# Patient Record
Sex: Female | Born: 1965 | Race: White | Hispanic: No | Marital: Married | Smoking: Current every day smoker
Health system: Southern US, Community
[De-identification: ages and names within clinical notes are randomized; demographics above are authoritative.]

## PROBLEM LIST (undated history)

## (undated) DIAGNOSIS — E039 Hypothyroidism, unspecified: Secondary | ICD-10-CM

## (undated) DIAGNOSIS — K529 Noninfective gastroenteritis and colitis, unspecified: Secondary | ICD-10-CM

## (undated) DIAGNOSIS — A0472 Enterocolitis due to Clostridium difficile, not specified as recurrent: Secondary | ICD-10-CM

## (undated) DIAGNOSIS — M1990 Unspecified osteoarthritis, unspecified site: Secondary | ICD-10-CM

## (undated) DIAGNOSIS — K219 Gastro-esophageal reflux disease without esophagitis: Secondary | ICD-10-CM

## (undated) HISTORY — PX: KNEE ARTHROSCOPY: SUR90

## (undated) HISTORY — PX: ABDOMINAL HYSTERECTOMY: SHX81

## (undated) HISTORY — PX: TONSILLECTOMY: SUR1361

## (undated) HISTORY — PX: OTHER SURGICAL HISTORY: SHX169

---

## 2006-10-21 ENCOUNTER — Ambulatory Visit: Payer: Self-pay | Admitting: Emergency Medicine

## 2007-03-04 ENCOUNTER — Ambulatory Visit: Payer: Self-pay | Admitting: Obstetrics and Gynecology

## 2007-11-13 ENCOUNTER — Ambulatory Visit: Payer: Self-pay | Admitting: Family Medicine

## 2009-04-01 ENCOUNTER — Ambulatory Visit: Payer: Self-pay | Admitting: Family Medicine

## 2009-07-20 ENCOUNTER — Ambulatory Visit: Payer: Self-pay | Admitting: Otolaryngology

## 2009-07-22 ENCOUNTER — Ambulatory Visit: Payer: Self-pay | Admitting: Obstetrics and Gynecology

## 2009-09-03 ENCOUNTER — Ambulatory Visit: Payer: Self-pay | Admitting: Otolaryngology

## 2009-09-08 ENCOUNTER — Ambulatory Visit: Payer: Self-pay | Admitting: Otolaryngology

## 2009-12-03 ENCOUNTER — Ambulatory Visit: Payer: Self-pay | Admitting: Otolaryngology

## 2010-03-04 ENCOUNTER — Ambulatory Visit: Payer: Self-pay | Admitting: Otolaryngology

## 2010-10-04 ENCOUNTER — Ambulatory Visit: Payer: Self-pay | Admitting: Family Medicine

## 2010-10-28 IMAGING — CT CT NECK WITH CONTRAST
1 of 2 series · 9 of 14 positions shown, 12 images · non-contrast
Comparison: none

REASON FOR EXAM: R NECK PAIN AND ACUTE LYMPHADENITIS
COMMENTS:

[Series 3: soft tissue · axial · 0.47mm/px · z∈[-346,-106]mm · 9 of 100 slices shown, 12 images]
[im 10/100  soft-tissue]
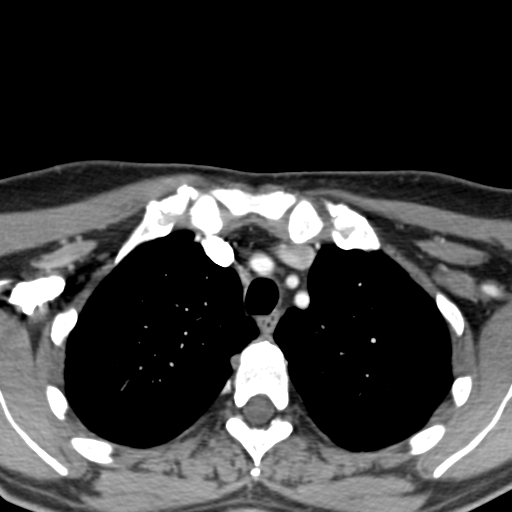
[im 10/100  bone]
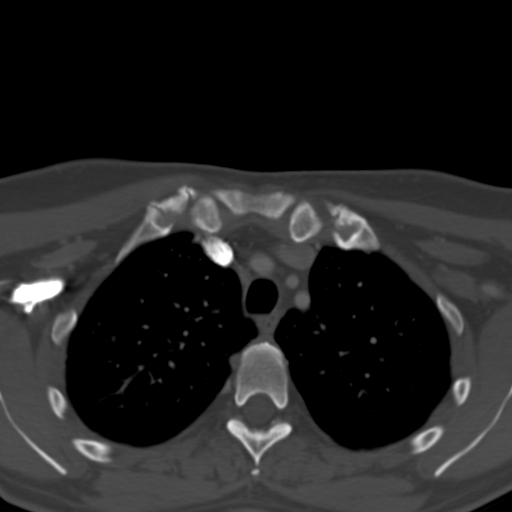
[im 20/100  bone]
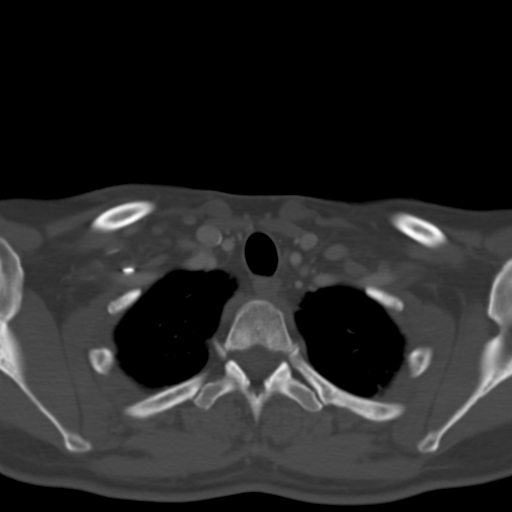
[im 30/100  bone]
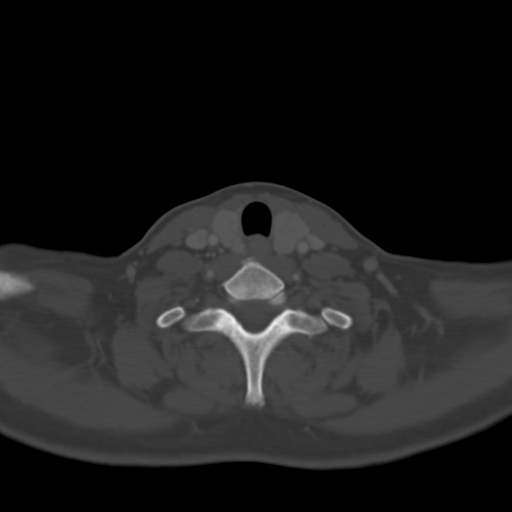
[im 40/100  bone]
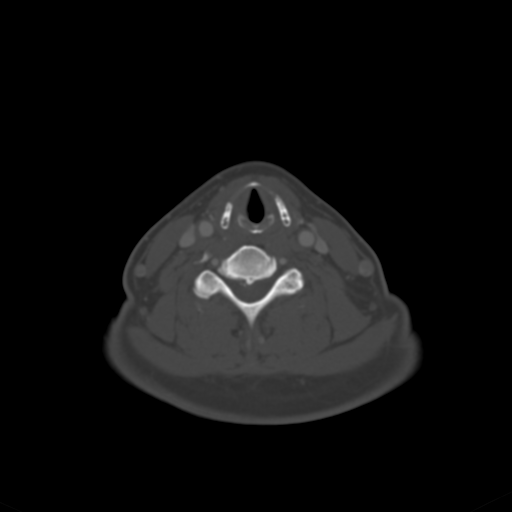
[im 50/100  soft-tissue]
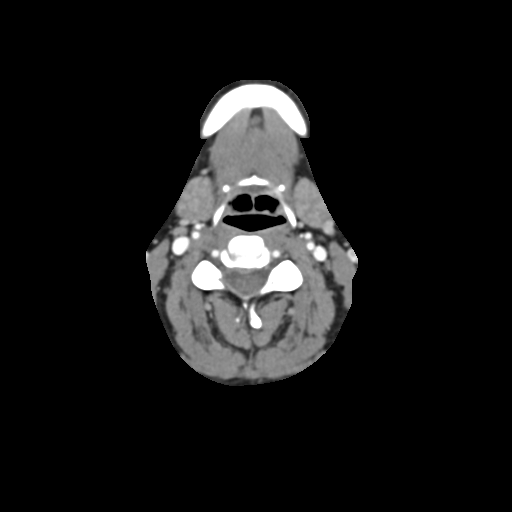
[im 50/100  bone]
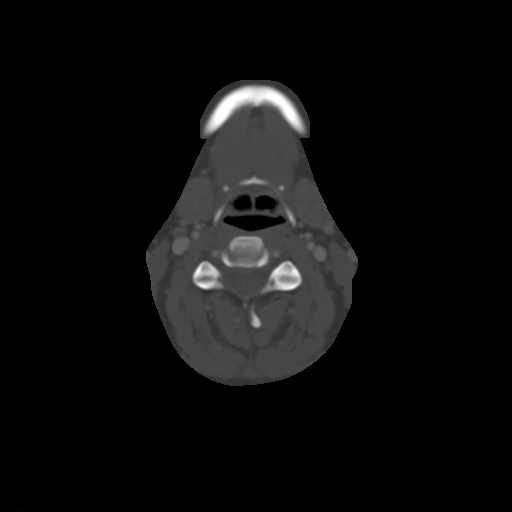
[im 60/100  bone]
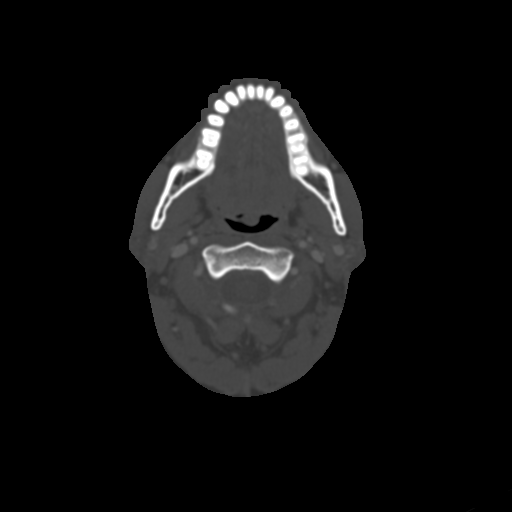
[im 70/100  bone]
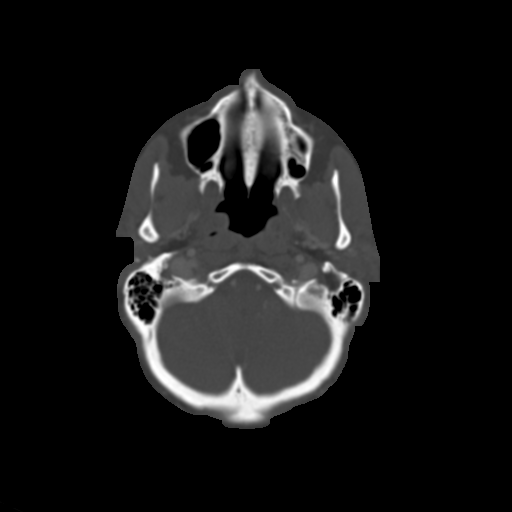
[im 80/100  bone]
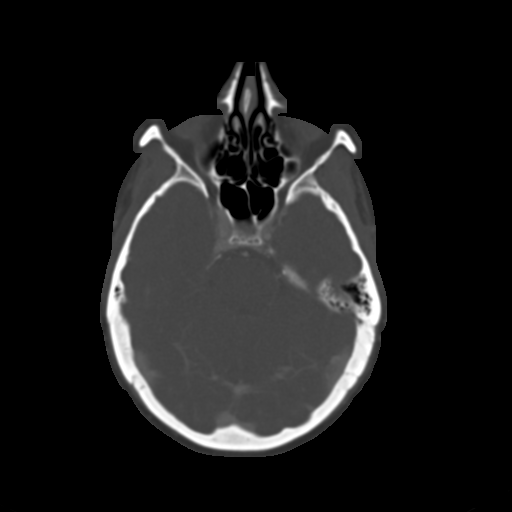
[im 90/100  soft-tissue]
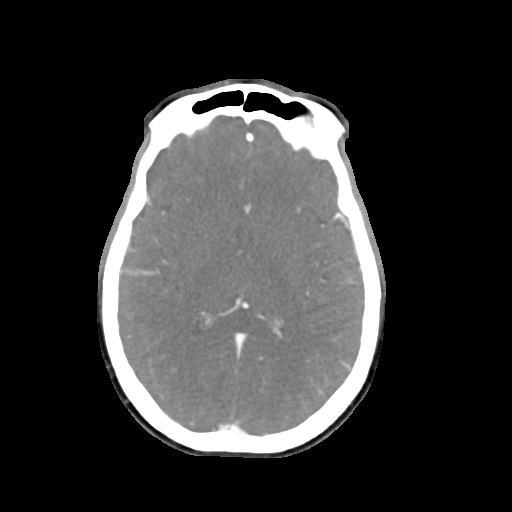
[im 90/100  bone]
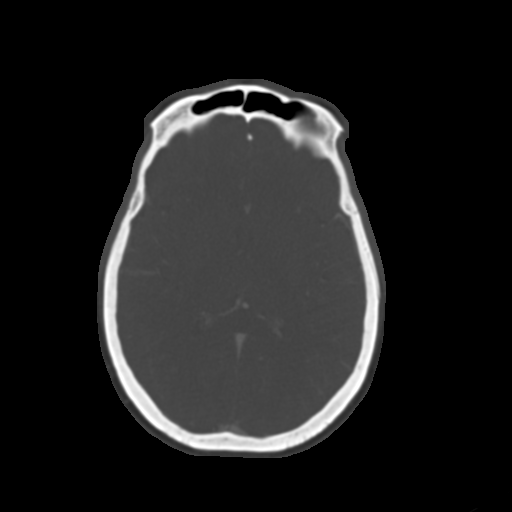

[9 of 14 positions shown; findings below may reference images not displayed]

PROCEDURE:     BENITEZ - BENITEZ NECK WITH CONTRAST  - September 08, 2009  [DATE]

RESULT:

Helical 3 mm sections were obtained from the skull base through the thoracic
inlet status post intravenous administration of 100 ml of Isovue 300.

Evaluation of the skull base demonstrates no evidence of masses nor regions
of abnormal enhancement, free fluid or loculated fluid collections.

Evaluation of the neck demonstrates no evidence of masses, free fluid. Lymph
nodes are appreciated within the anterior and posterior cervical chain as
well as within the submental region. The largest lymph node projects in the
submental region measuring approximately 7 mm in diameter. Vascular
structures are patent. The lung apices are unremarkable. The airway is
patent.
IMPRESSION: 1. Small, nonpathologic lymph nodes within the anterior and posterior
cervical chain without evidence of masses, free fluid nor loculated fluid
collections.

## 2011-04-25 ENCOUNTER — Ambulatory Visit: Payer: Self-pay | Admitting: Otolaryngology

## 2011-10-26 ENCOUNTER — Ambulatory Visit: Payer: Self-pay | Admitting: Family Medicine

## 2011-11-23 IMAGING — US ULTRASOUND RIGHT BREAST
1 series · 9 of 9 positions shown · non-contrast
Comparison: none

REASON FOR EXAM: nodule
COMMENTS:

PROCEDURE:     US  - US BREAST RIGHT  - October 04, 2010  [DATE]
RESULT:     Ultrasound of the 8 o'clock to 10 o'clock position of the right
breast reveals no discrete cystic or solid mass. The echotexture of the
imaged parenchyma was normal.

[Series 1: ultrasound right breast · 9 of 9 slices shown]
[im 1/9]
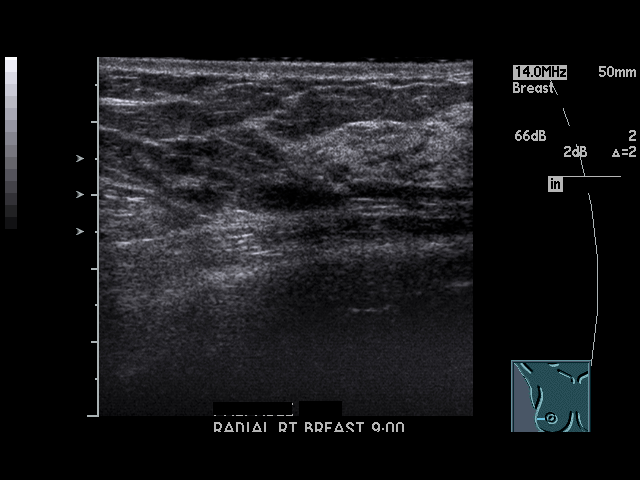
[im 2/9]
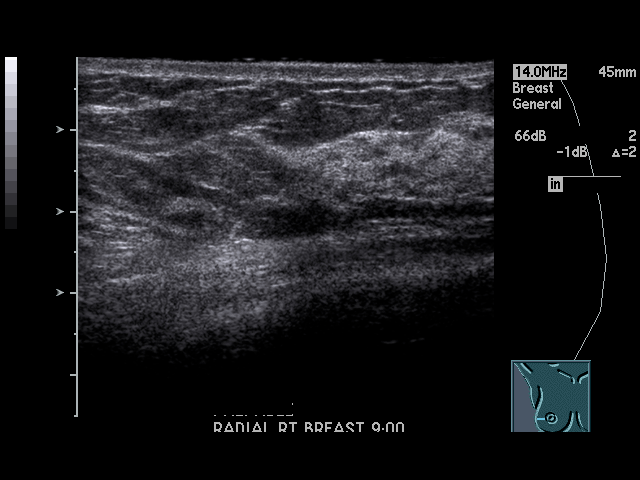
[im 3/9]
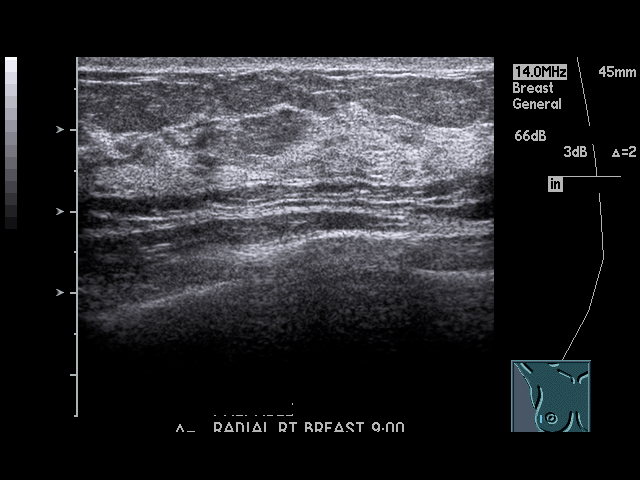
[im 4/9]
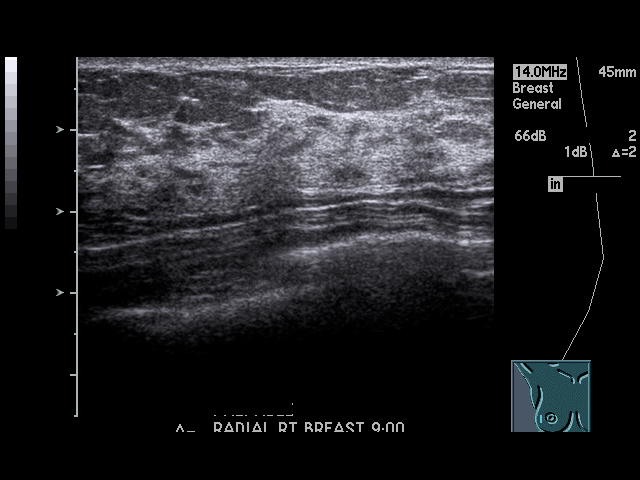
[im 5/9]
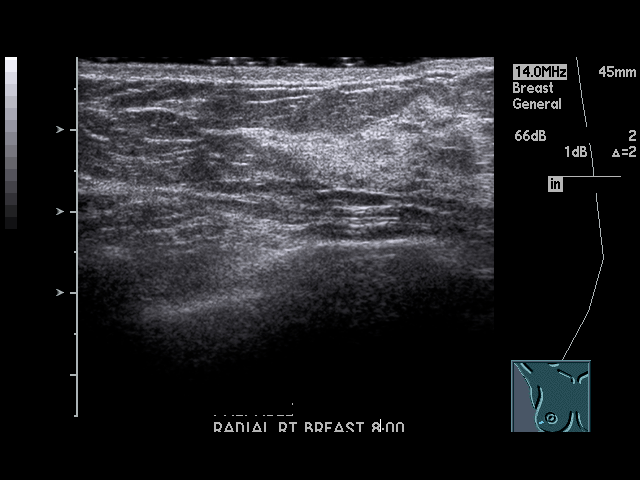
[im 6/9]
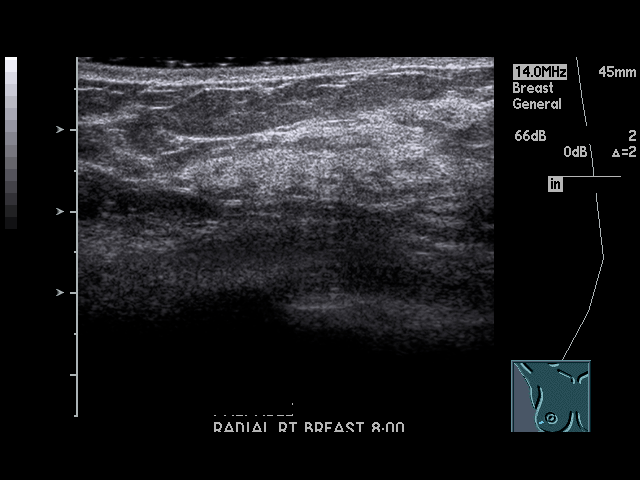
[im 7/9]
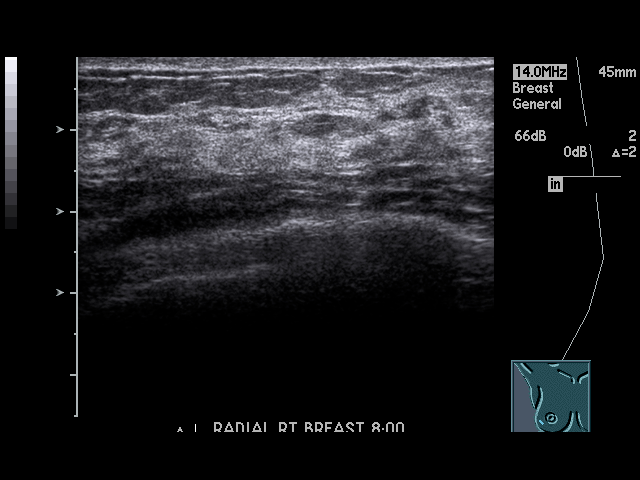
[im 8/9]
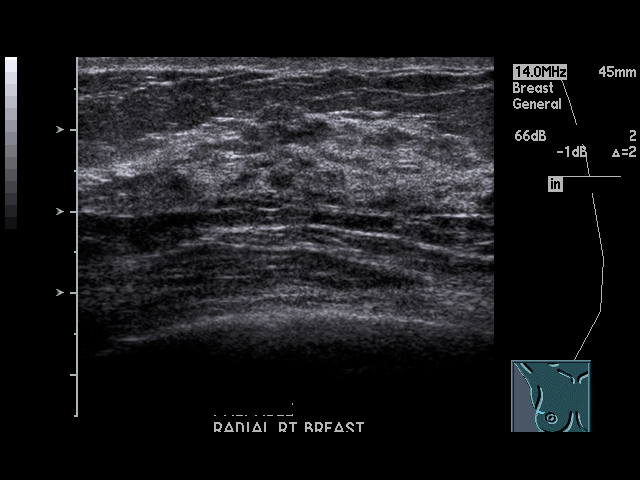
[im 9/9]
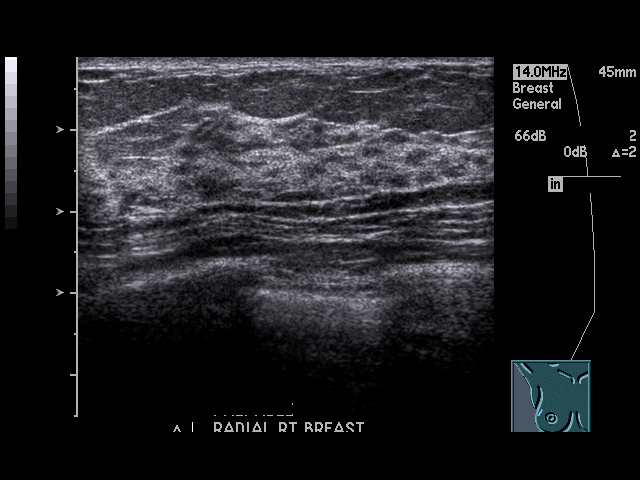

[9 of 9 positions shown; findings below may reference images not displayed]

IMPRESSION: 1.I do not see suspicious findings on this directed ultrasound of the right
breast. Please see the dictation of the diagnostic mammogram of this same
day for final recommendations and BI-RADS classification.

## 2012-06-14 ENCOUNTER — Ambulatory Visit: Payer: Self-pay | Admitting: Otolaryngology

## 2012-12-19 ENCOUNTER — Ambulatory Visit: Payer: Self-pay | Admitting: Otolaryngology

## 2012-12-19 LAB — T4, FREE: Free Thyroxine: 1.1 ng/dL (ref 0.76–1.46)

## 2012-12-19 LAB — TSH: Thyroid Stimulating Horm: 2.36 u[IU]/mL

## 2014-04-30 ENCOUNTER — Ambulatory Visit: Payer: Self-pay | Admitting: Otolaryngology

## 2014-04-30 LAB — TSH: Thyroid Stimulating Horm: 2.35 u[IU]/mL

## 2014-04-30 LAB — T4, FREE: Free Thyroxine: 1.03 ng/dL (ref 0.76–1.46)

## 2015-12-09 ENCOUNTER — Encounter: Payer: Self-pay | Admitting: *Deleted

## 2015-12-09 DIAGNOSIS — K640 First degree hemorrhoids: Secondary | ICD-10-CM | POA: Diagnosis not present

## 2015-12-09 DIAGNOSIS — M1990 Unspecified osteoarthritis, unspecified site: Secondary | ICD-10-CM | POA: Diagnosis not present

## 2015-12-09 DIAGNOSIS — Z90710 Acquired absence of both cervix and uterus: Secondary | ICD-10-CM | POA: Diagnosis not present

## 2015-12-09 DIAGNOSIS — Z881 Allergy status to other antibiotic agents status: Secondary | ICD-10-CM | POA: Diagnosis not present

## 2015-12-09 DIAGNOSIS — K219 Gastro-esophageal reflux disease without esophagitis: Secondary | ICD-10-CM | POA: Diagnosis not present

## 2015-12-09 DIAGNOSIS — E039 Hypothyroidism, unspecified: Secondary | ICD-10-CM | POA: Diagnosis not present

## 2015-12-09 DIAGNOSIS — Z888 Allergy status to other drugs, medicaments and biological substances status: Secondary | ICD-10-CM | POA: Diagnosis not present

## 2015-12-09 DIAGNOSIS — Z79899 Other long term (current) drug therapy: Secondary | ICD-10-CM | POA: Diagnosis not present

## 2015-12-09 DIAGNOSIS — F17210 Nicotine dependence, cigarettes, uncomplicated: Secondary | ICD-10-CM | POA: Diagnosis not present

## 2015-12-09 DIAGNOSIS — D124 Benign neoplasm of descending colon: Secondary | ICD-10-CM | POA: Diagnosis not present

## 2015-12-09 DIAGNOSIS — A047 Enterocolitis due to Clostridium difficile: Secondary | ICD-10-CM | POA: Diagnosis present

## 2015-12-10 ENCOUNTER — Encounter
Admission: RE | Disposition: A | Discharge status: Home / Self Care | Payer: Self-pay | Source: Ambulatory Visit | Attending: Unknown Physician Specialty

## 2015-12-10 ENCOUNTER — Ambulatory Visit: Payer: BLUE CROSS/BLUE SHIELD | Admitting: Certified Registered Nurse Anesthetist

## 2015-12-10 ENCOUNTER — Encounter: Payer: Self-pay | Admitting: Anesthesiology

## 2015-12-10 ENCOUNTER — Ambulatory Visit
Admission: RE | Admit: 2015-12-10 | Discharge: 2015-12-10 | Disposition: A | Discharge status: Home / Self Care | Payer: BLUE CROSS/BLUE SHIELD | Source: Ambulatory Visit | Attending: Unknown Physician Specialty | Admitting: Unknown Physician Specialty

## 2015-12-10 DIAGNOSIS — M1990 Unspecified osteoarthritis, unspecified site: Secondary | ICD-10-CM | POA: Insufficient documentation

## 2015-12-10 DIAGNOSIS — Z888 Allergy status to other drugs, medicaments and biological substances status: Secondary | ICD-10-CM | POA: Insufficient documentation

## 2015-12-10 DIAGNOSIS — A047 Enterocolitis due to Clostridium difficile: Secondary | ICD-10-CM | POA: Insufficient documentation

## 2015-12-10 DIAGNOSIS — E039 Hypothyroidism, unspecified: Secondary | ICD-10-CM | POA: Insufficient documentation

## 2015-12-10 DIAGNOSIS — Z90710 Acquired absence of both cervix and uterus: Secondary | ICD-10-CM | POA: Insufficient documentation

## 2015-12-10 DIAGNOSIS — K219 Gastro-esophageal reflux disease without esophagitis: Secondary | ICD-10-CM | POA: Insufficient documentation

## 2015-12-10 DIAGNOSIS — Z881 Allergy status to other antibiotic agents status: Secondary | ICD-10-CM | POA: Insufficient documentation

## 2015-12-10 DIAGNOSIS — D124 Benign neoplasm of descending colon: Secondary | ICD-10-CM | POA: Insufficient documentation

## 2015-12-10 DIAGNOSIS — Z79899 Other long term (current) drug therapy: Secondary | ICD-10-CM | POA: Insufficient documentation

## 2015-12-10 DIAGNOSIS — K640 First degree hemorrhoids: Secondary | ICD-10-CM | POA: Insufficient documentation

## 2015-12-10 DIAGNOSIS — F17210 Nicotine dependence, cigarettes, uncomplicated: Secondary | ICD-10-CM | POA: Insufficient documentation

## 2015-12-10 HISTORY — DX: Gastro-esophageal reflux disease without esophagitis: K21.9

## 2015-12-10 HISTORY — DX: Hypothyroidism, unspecified: E03.9

## 2015-12-10 HISTORY — DX: Enterocolitis due to Clostridium difficile, not specified as recurrent: A04.72

## 2015-12-10 HISTORY — DX: Noninfective gastroenteritis and colitis, unspecified: K52.9

## 2015-12-10 HISTORY — PX: COLONOSCOPY WITH PROPOFOL: SHX5780

## 2015-12-10 HISTORY — PX: FECAL TRANSPLANT: SHX6383

## 2015-12-10 HISTORY — DX: Unspecified osteoarthritis, unspecified site: M19.90

## 2015-12-10 SURGERY — COLONOSCOPY WITH PROPOFOL
Anesthesia: General

## 2015-12-10 MED ORDER — ONDANSETRON HCL 4 MG/2ML IJ SOLN
INTRAMUSCULAR | Status: DC | PRN
  Administered 2015-12-10: 4 mg via INTRAVENOUS

## 2015-12-10 MED ORDER — SODIUM CHLORIDE 0.9 % IV SOLN: INTRAVENOUS | Status: DC

## 2015-12-10 MED ORDER — LIDOCAINE HCL (CARDIAC) 20 MG/ML IV SOLN
INTRAVENOUS | Status: DC | PRN
  Administered 2015-12-10: 60 mg via INTRAVENOUS

## 2015-12-10 MED ORDER — SODIUM CHLORIDE 0.9 % IV SOLN
INTRAVENOUS | Status: DC
  Administered 2015-12-10: 1000 mL via INTRAVENOUS

## 2015-12-10 MED ORDER — PROPOFOL 10 MG/ML IV BOLUS
INTRAVENOUS | Status: DC | PRN
  Administered 2015-12-10: 60 mg via INTRAVENOUS
  Administered 2015-12-10: 40 mg via INTRAVENOUS

## 2015-12-10 MED ORDER — PROPOFOL 500 MG/50ML IV EMUL
INTRAVENOUS | Status: DC | PRN
  Administered 2015-12-10: 200 ug/kg/min via INTRAVENOUS

## 2015-12-10 NOTE — Anesthesia Postprocedure Evaluation (Signed)
Anesthesia Post Note  Patient: Emily Allison  Procedure(s) Performed: Procedure(s) (LRB): COLONOSCOPY WITH PROPOFOL (N/A) FECAL TRANSPLANT (N/A)  Patient location during evaluation: Endoscopy Anesthesia Type: General Level of consciousness: awake and alert Pain management: pain level controlled Vital Signs Assessment: post-procedure vital signs reviewed and stable Respiratory status: spontaneous breathing, nonlabored ventilation, respiratory function stable and patient connected to nasal cannula oxygen Cardiovascular status: blood pressure returned to baseline and stable Postop Assessment: no signs of nausea or vomiting Anesthetic complications: no    Last Vitals:  Filed Vitals:   12/10/15 1150 12/10/15 1200  BP: 121/70 106/71  Pulse: 58 59  Temp:    Resp: 11 16    Last Pain: There were no vitals filed for this visit.               Martha Clan

## 2015-12-10 NOTE — Anesthesia Preprocedure Evaluation (Signed)
Anesthesia Evaluation  Patient identified by MRN, date of birth, ID band Patient awake    Reviewed: Allergy & Precautions, H&P , NPO status , Patient's Chart, lab work & pertinent test results, reviewed documented beta blocker date and time   History of Anesthesia Complications Negative for: history of anesthetic complications  Airway Mallampati: II  TM Distance: >3 FB Neck ROM: full    Dental no notable dental hx. (+) Caps, Teeth Intact   Pulmonary neg shortness of breath, neg sleep apnea, neg COPD, neg recent URI, Current Smoker,    Pulmonary exam normal breath sounds clear to auscultation       Cardiovascular Exercise Tolerance: Good negative cardio ROS Normal cardiovascular exam Rhythm:regular Rate:Normal     Neuro/Psych negative neurological ROS  negative psych ROS   GI/Hepatic Neg liver ROS, GERD  ,  Endo/Other  diabetes (borderline)Hypothyroidism   Renal/GU negative Renal ROS  negative genitourinary   Musculoskeletal   Abdominal   Peds  Hematology negative hematology ROS (+)   Anesthesia Other Findings Past Medical History:   GERD (gastroesophageal reflux disease)                       Colitis                                                      Arthritis                                                    Hypothyroidism                                               Clostridium difficile diarrhea                               Reproductive/Obstetrics negative OB ROS                             Anesthesia Physical Anesthesia Plan  ASA: II  Anesthesia Plan: General   Post-op Pain Management:    Induction:   Airway Management Planned:   Additional Equipment:   Intra-op Plan:   Post-operative Plan:   Informed Consent: I have reviewed the patients History and Physical, chart, labs and discussed the procedure including the risks, benefits and alternatives for the proposed  anesthesia with the patient or authorized representative who has indicated his/her understanding and acceptance.   Dental Advisory Given  Plan Discussed with: Anesthesiologist, CRNA and Surgeon  Anesthesia Plan Comments:         Anesthesia Quick Evaluation

## 2015-12-10 NOTE — H&P (Signed)
Primary Care Physician:  Midge Minium, PA Primary Gastroenterologist:  Dr. Vira Agar  Pre-Procedure History & Physical: HPI:  Emily Allison is a 50 y.o. female is here for an colonoscopy.   Past Medical History  Diagnosis Date  . GERD (gastroesophageal reflux disease)   . Colitis   . Arthritis   . Hypothyroidism   . Clostridium difficile diarrhea     Past Surgical History  Procedure Laterality Date  . Knee arthroscopy Right   . Abdominal hysterectomy    . Tonsillectomy    . Excision synovial cyst popliteal space Right     Prior to Admission medications   Medication Sig Start Date End Date Taking? Authorizing Provider  ALPRAZolam (XANAX) 0.25 MG tablet Take 0.25 mg by mouth 3 (three) times daily as needed for anxiety.   Yes Historical Provider, MD  dicyclomine (BENTYL) 10 MG capsule Take 10 mg by mouth 4 (four) times daily -  before meals and at bedtime.   Yes Historical Provider, MD  fidaxomicin (DIFICID) 200 MG TABS tablet Take 200 mg by mouth 2 (two) times daily.   Yes Historical Provider, MD  LACTOBACILLUS PO Take 1 tablet by mouth daily.   Yes Historical Provider, MD  levothyroxine (SYNTHROID, LEVOTHROID) 50 MCG tablet Take 50 mcg by mouth daily before breakfast.   Yes Historical Provider, MD  omeprazole (PRILOSEC) 20 MG capsule Take 20 mg by mouth daily.   Yes Historical Provider, MD  vancomycin (VANCOCIN) 250 MG capsule Take 250 mg by mouth 4 (four) times daily. Reported on 12/10/2015    Historical Provider, MD    Allergies as of 12/09/2015 - Review Complete 12/09/2015  Allergen Reaction Noted  . Amoxicillin Swelling 12/09/2015  . Nsaids  12/09/2015  . Bactrim [sulfamethoxazole-trimethoprim] Rash 12/09/2015  . Ceclor [cefaclor] Rash 12/09/2015    History reviewed. No pertinent family history.  Social History   Social History  . Marital Status: Married    Spouse Name: N/A  . Number of Children: N/A  . Years of Education: N/A   Occupational  History  . Not on file.   Social History Main Topics  . Smoking status: Current Every Day Smoker -- 1.00 packs/day    Types: Cigarettes  . Smokeless tobacco: Never Used  . Alcohol Use: 1.8 oz/week    3 Glasses of wine per week  . Drug Use: No  . Sexual Activity: Not on file   Other Topics Concern  . Not on file   Social History Narrative    Review of Systems: See HPI, otherwise negative ROS  Physical Exam: BP 106/71 mmHg  Pulse 59  Temp(Src) 97.5 F (36.4 C) (Tympanic)  Resp 16  Ht 5' 3.5" (1.613 m)  Wt 59.875 kg (132 lb)  BMI 23.01 kg/m2  SpO2 94% General:   Alert,  pleasant and cooperative in NAD Head:  Normocephalic and atraumatic. Neck:  Supple; no masses or thyromegaly. Lungs:  Clear throughout to auscultation.    Heart:  Regular rate and rhythm. Abdomen:  Soft, nontender and nondistended. Normal bowel sounds, without guarding, and without rebound.   Neurologic:  Alert and  oriented x4;  grossly normal neurologically.  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  Pt referred from Dr. Princella Ion  Risks, benefits, limitations, and alternatives regarding  colonoscopy have been reviewed with the patient.  Questions have been answered.  All parties agreeable.   Gaylyn Cheers, MD  12/10/2015, 5:18 PM

## 2015-12-10 NOTE — Op Note (Signed)
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 Procedure:            Pre-Anesthesia Assessment:                       - After reviewing the risks and benefits, the patient                        was deemed in satisfactory condition to undergo the                        procedure.                       After obtaining informed consent, the colonoscope was                        passed under direct vision. Throughout the procedure,                        the patient's blood pressure, pulse, and oxygen                        saturations were monitored continuously. The                        Colonoscope was introduced through the anus and                        advanced to the the cecum, identified by appendiceal                        orifice and ileocecal valve. The colonoscopy was                        performed without difficulty. The patient tolerated the                        procedure well. The quality of the bowel preparation                        was good. Findings:      A diminutive polyp was found in the sigmoid/descending colon. The polyp       was sessile. I do not remove polyps during Fecal Microbiota Transplant       (Bacteriotherapy): Donor stool was prepared by me using milk as per  protocol. Approximately 350 mL of the emulsified donor stool was       instilled in the transverse colon, at the  hepatic flexure, in the       ascending colon and in the cecum. A detailed colonoscopic exam could not       be performed upon scope withdrawal secondary to limited visibility from       the instilled stool.      Internal hemorrhoids were found during endoscopy. The hemorrhoids were       small and Grade I (internal hemorrhoids that do not prolapse).      The exam was otherwise without abnormality. Impression:           - One diminutive polyp in the descending colon. See Dr.                        Princella Ion to have this removed within the year.                       - Internal hemorrhoids.                       - The examination was otherwise normal.                       - Fecal Microbiota Transplant (Bacteriotherapy)                        performed in the transverse colon, in the hepatic                        flexure, in the ascending colon and in the cecum.                       - No specimens collected. Recommendation:       - The findings and recommendations were discussed with                        the patient's family. To remain in recovery area for 4                        hours with turning every 30 minutes. Manya Silvas, MD 12/10/2015 11:16:55 AM This report has been signed electronically. Number of Addenda: 0 Note Initiated On: 12/10/2015 10:53 AM Scope Withdrawal Time: 0 hours 6 minutes 26 seconds  Total Procedure Duration: 0 hours 11 minutes 44 seconds       [facility]

## 2015-12-10 NOTE — Transfer of Care (Signed)
Immediate Anesthesia Transfer of Care Note  Patient: Emily Allison  Procedure(s) Performed: Procedure(s): COLONOSCOPY WITH PROPOFOL (N/A) FECAL TRANSPLANT (N/A)  Patient Location: Endoscopy Unit  Anesthesia Type:General  Level of Consciousness: awake, alert  and oriented  Airway & Oxygen Therapy: Patient connected to nasal cannula oxygen  Post-op Assessment: Post -op Vital signs reviewed and stable  Post vital signs: stable  Last Vitals:  Filed Vitals:   12/10/15 0953 12/10/15 1110  BP: 142/93 106/74  Pulse: 92 67  Temp: 37.1 C 36.4 C  Resp: 16 18    Last Pain: There were no vitals filed for this visit.       Complications: No apparent anesthesia complications

## 2015-12-12 ENCOUNTER — Encounter: Payer: Self-pay | Admitting: Unknown Physician Specialty
